# Patient Record
Sex: Male | Born: 1970
Health system: Southern US, Community
[De-identification: ages and names within clinical notes are randomized; demographics above are authoritative.]

## PROBLEM LIST (undated history)

## (undated) DIAGNOSIS — I10 Essential (primary) hypertension: Secondary | ICD-10-CM

## (undated) DIAGNOSIS — E7800 Pure hypercholesterolemia, unspecified: Secondary | ICD-10-CM

## (undated) HISTORY — PX: WISDOM TOOTH EXTRACTION: SHX21

## (undated) HISTORY — PX: TONSILLECTOMY: SUR1361

---

## 2012-03-07 DIAGNOSIS — L909 Atrophic disorder of skin, unspecified: Secondary | ICD-10-CM | POA: Insufficient documentation

## 2012-03-08 ENCOUNTER — Emergency Department (HOSPITAL_BASED_OUTPATIENT_CLINIC_OR_DEPARTMENT_OTHER)
Admission: EM | Admit: 2012-03-08 | Discharge: 2012-03-08 | Disposition: A | Discharge status: Home / Self Care | Payer: BC Managed Care – PPO | Attending: Emergency Medicine | Admitting: Emergency Medicine

## 2015-11-26 DIAGNOSIS — I10 Essential (primary) hypertension: Secondary | ICD-10-CM | POA: Insufficient documentation

## 2016-01-22 DIAGNOSIS — R7301 Impaired fasting glucose: Secondary | ICD-10-CM | POA: Insufficient documentation

## 2016-01-22 DIAGNOSIS — E7800 Pure hypercholesterolemia, unspecified: Secondary | ICD-10-CM | POA: Insufficient documentation

## 2016-01-22 DIAGNOSIS — R748 Abnormal levels of other serum enzymes: Secondary | ICD-10-CM | POA: Insufficient documentation

## 2016-02-25 DIAGNOSIS — G4733 Obstructive sleep apnea (adult) (pediatric): Secondary | ICD-10-CM | POA: Insufficient documentation

## 2016-08-17 ENCOUNTER — Emergency Department (HOSPITAL_BASED_OUTPATIENT_CLINIC_OR_DEPARTMENT_OTHER)
Admission: EM | Admit: 2016-08-17 | Discharge: 2016-08-17 | Disposition: A | Discharge status: Home / Self Care | Payer: BLUE CROSS/BLUE SHIELD | Attending: Emergency Medicine | Admitting: Emergency Medicine

## 2016-08-17 ENCOUNTER — Encounter (HOSPITAL_BASED_OUTPATIENT_CLINIC_OR_DEPARTMENT_OTHER): Payer: Self-pay | Admitting: *Deleted

## 2016-08-17 DIAGNOSIS — Z79899 Other long term (current) drug therapy: Secondary | ICD-10-CM | POA: Diagnosis not present

## 2016-08-17 DIAGNOSIS — K0889 Other specified disorders of teeth and supporting structures: Secondary | ICD-10-CM | POA: Diagnosis present

## 2016-08-17 DIAGNOSIS — I10 Essential (primary) hypertension: Secondary | ICD-10-CM | POA: Diagnosis not present

## 2016-08-17 HISTORY — DX: Pure hypercholesterolemia, unspecified: E78.00

## 2016-08-17 HISTORY — DX: Essential (primary) hypertension: I10

## 2016-08-17 MED ORDER — TRAMADOL HCL 50 MG PO TABS: 50.0000 mg | ORAL_TABLET | Freq: Four times a day (QID) | ORAL | 0 refills | Status: AC | PRN

## 2016-08-17 MED ORDER — AMOXICILLIN 500 MG PO CAPS: 500.0000 mg | ORAL_CAPSULE | Freq: Three times a day (TID) | ORAL | 0 refills | Status: AC

## 2016-08-17 NOTE — ED Provider Notes (Signed)
[facility] Provider [facility]   CSN: [id number] Arrival date & time: 08/17/16  [id number]     History   Chief Complaint Chief Complaint  Patient presents with  . Nasal Congestion  . Dental Pain    HPI James Sutton is a 45 y.o. male. Emergency evaluation of tooth pain. His left maxillary first molar that has become painful. Hurts to bite. He feels like he might be swollen in his face as well. Minimal cough. Minimal URI symptoms. No sinus pressure  HPI  Past Medical History:  Diagnosis Date  . Hypercholesteremia   . Hypertension     There are no active problems to display for this patient.   History reviewed. No pertinent surgical history.     Home Medications    Prior to Admission medications   Medication Sig Start Date End Date Taking? Authorizing Provider  amLODipine-olmesartan (AZOR) 10-40 MG tablet Take 1 tablet by mouth daily.   Yes Historical Provider, MD  atorvastatin (LIPITOR) 40 MG tablet Take 40 mg by mouth daily at 6 PM.   Yes Historical Provider, MD  cloNIDine (CATAPRES) 0.1 MG tablet Take 0.1 mg by mouth 2 (two) times daily.   Yes Historical Provider, MD  hydrALAZINE (APRESOLINE) 50 MG tablet Take 50 mg by mouth 3 (three) times daily.   Yes Historical Provider, MD  labetalol (NORMODYNE) 300 MG tablet Take 300 mg by mouth 2 (two) times daily.   Yes Historical Provider, MD  potassium chloride SA (K-DUR,KLOR-CON) 20 MEQ tablet Take 40 mEq by mouth 2 (two) times daily.   Yes Historical Provider, MD  spironolactone (ALDACTONE) 25 MG tablet Take 50 mg by mouth daily.   Yes Historical Provider, MD  amoxicillin (AMOXIL) 500 MG capsule Take 1 capsule (500 mg total) by mouth 3 (three) times daily. 08/17/16   Rolland PorterMark Senia Even, MD  traMADol (ULTRAM) 50 MG tablet Take 1 tablet (50 mg total) by mouth every 6 (six) hours as needed. 08/17/16   Rolland PorterMark Nico Syme, MD    Family History No family history on file.  Social History Social History  Substance Use Topics  . Smoking  status: Never Smoker  . Smokeless tobacco: Never Used  . Alcohol use Yes     Comment: occ throughout month     Allergies   Patient has no known allergies.   Review of Systems Review of Systems  Constitutional: Negative for appetite change, chills, diaphoresis, fatigue and fever.  HENT: Positive for dental problem and sinus pain. Negative for mouth sores, sore throat and trouble swallowing.   Eyes: Negative for visual disturbance.  Respiratory: Negative for cough, chest tightness, shortness of breath and wheezing.   Cardiovascular: Negative for chest pain.  Gastrointestinal: Negative for abdominal distention, abdominal pain, diarrhea, nausea and vomiting.  Endocrine: Negative for polydipsia, polyphagia and polyuria.  Genitourinary: Negative for dysuria, frequency and hematuria.  Musculoskeletal: Negative for gait problem.  Skin: Negative for color change, pallor and rash.  Neurological: Negative for dizziness, syncope, light-headedness and headaches.  Hematological: Does not bruise/bleed easily.  Psychiatric/Behavioral: Negative for behavioral problems and confusion.     Physical Exam Updated Vital Signs BP 140/72 (BP Location: Left Arm)   Pulse 89   Temp 98.8 F (37.1 C) (Oral)   Resp 18   Ht 5\' 11"  (1.803 m)   Wt 265 lb (120.2 kg)   SpO2 98%   BMI 36.96 kg/m   Physical Exam  Constitutional: He is oriented to person, place, and time. He appears well-developed and  well-nourished. No distress.  HENT:  Head: Normocephalic.  No-show facial swelling. Tooth of concern is his left maxillary first molar. His second molar is absent. There is no periodontal swelling no obvious tooth injury or fracture. No gingival reaction.  Eyes: Conjunctivae are normal. Pupils are equal, round, and reactive to light. No scleral icterus.  Neck: Normal range of motion. Neck supple. No thyromegaly present.  Cardiovascular: Normal rate and regular rhythm.  Exam reveals no gallop and no friction  rub.   No murmur heard. Pulmonary/Chest: Effort normal and breath sounds normal. No respiratory distress. He has no wheezes. He has no rales.  Abdominal: Soft. Bowel sounds are normal. He exhibits no distension. There is no tenderness. There is no rebound.  Musculoskeletal: Normal range of motion.  Neurological: He is alert and oriented to person, place, and time.  Skin: Skin is warm and dry. No rash noted.  Psychiatric: He has a normal mood and affect. His behavior is normal.     ED Treatments / Results  Labs (all labs ordered are listed, but only abnormal results are displayed) Labs Reviewed - No data to display  EKG  EKG Interpretation None       Radiology No results found.  Procedures Procedures (including critical care time)  Medications Ordered in ED Medications - No data to display   Initial Impression / Assessment and Plan / ED Course  I have reviewed the triage vital signs and the nursing notes.  Pertinent labs & imaging results that were available during my care of the patient were reviewed by me and considered in my medical decision making (see chart for details).  Clinical Course     Possibilities for yielded included periodontal infection or periapical abscess versus sinus infection with referred pain. Plan amoxicillin. Dental follow up  Final Clinical Impressions(s) / ED Diagnoses   Final diagnoses:  Pain, dental    New Prescriptions New Prescriptions   AMOXICILLIN (AMOXIL) 500 MG CAPSULE    Take 1 capsule (500 mg total) by mouth 3 (three) times daily.   TRAMADOL (ULTRAM) 50 MG TABLET    Take 1 tablet (50 mg total) by mouth every 6 (six) hours as needed.     Rolland PorterMark Janiah Devinney, MD 08/17/16 [phone]

## 2016-08-17 NOTE — ED Triage Notes (Signed)
Pt reports nasal congestion, productive cough and sinus pressure x2days. Denies sore throat, n/v/d. Reports chills and sweats. Also reports L upper dental pain that began yesterday.

## 2016-08-17 NOTE — Discharge Instructions (Signed)
Dental follow-up as soon as possible.

## 2016-08-25 ENCOUNTER — Emergency Department (HOSPITAL_BASED_OUTPATIENT_CLINIC_OR_DEPARTMENT_OTHER)
Admission: EM | Admit: 2016-08-25 | Discharge: 2016-08-25 | Disposition: A | Discharge status: Home / Self Care | Payer: BLUE CROSS/BLUE SHIELD | Attending: Emergency Medicine | Admitting: Emergency Medicine

## 2016-08-25 ENCOUNTER — Emergency Department (HOSPITAL_BASED_OUTPATIENT_CLINIC_OR_DEPARTMENT_OTHER): Payer: BLUE CROSS/BLUE SHIELD

## 2016-08-25 ENCOUNTER — Encounter (HOSPITAL_BASED_OUTPATIENT_CLINIC_OR_DEPARTMENT_OTHER): Payer: Self-pay | Admitting: *Deleted

## 2016-08-25 DIAGNOSIS — Z79899 Other long term (current) drug therapy: Secondary | ICD-10-CM | POA: Diagnosis not present

## 2016-08-25 DIAGNOSIS — M25571 Pain in right ankle and joints of right foot: Secondary | ICD-10-CM | POA: Insufficient documentation

## 2016-08-25 DIAGNOSIS — I10 Essential (primary) hypertension: Secondary | ICD-10-CM | POA: Diagnosis not present

## 2016-08-25 MED ORDER — HYDROCODONE-ACETAMINOPHEN 5-325 MG PO TABS: 1.0000 | ORAL_TABLET | ORAL | 0 refills | Status: AC | PRN

## 2016-08-25 NOTE — ED Notes (Signed)
ED Provider at bedside. 

## 2016-08-25 NOTE — ED Provider Notes (Signed)
[facility]   CSN: [id number] Arrival date & time: 08/25/16  0757     History   Chief Complaint Chief Complaint  Patient presents with  . Foot Pain    HPI James Sutton is a 46 y.o. male.  HPI  Patient has had pain in his right ankle for the last 2 days. States he woke up in the morning with the pain. No trauma. No known injury. He has not had pains like this before. No knee pain. No chest pain or trouble breathing. No fevers. Past Medical History:  Diagnosis Date  . Hypercholesteremia   . Hypertension     There are no active problems to display for this patient.   Past Surgical History:  Procedure Laterality Date  . TONSILLECTOMY    . WISDOM TOOTH EXTRACTION         Home Medications    Prior to Admission medications   Medication Sig Start Date End Date Taking? Authorizing Provider  amLODipine-olmesartan (AZOR) 10-40 MG tablet Take 1 tablet by mouth daily.   Yes Historical Provider, MD  atorvastatin (LIPITOR) 40 MG tablet Take 40 mg by mouth daily at 6 PM.   Yes Historical Provider, MD  cloNIDine (CATAPRES) 0.1 MG tablet Take 0.1 mg by mouth 2 (two) times daily.   Yes Historical Provider, MD  hydrALAZINE (APRESOLINE) 50 MG tablet Take 50 mg by mouth 3 (three) times daily.   Yes Historical Provider, MD  labetalol (NORMODYNE) 300 MG tablet Take 300 mg by mouth 2 (two) times daily.   Yes Historical Provider, MD  potassium chloride SA (K-DUR,KLOR-CON) 20 MEQ tablet Take 40 mEq by mouth 2 (two) times daily.   Yes Historical Provider, MD  spironolactone (ALDACTONE) 25 MG tablet Take 50 mg by mouth daily.   Yes Historical Provider, MD  traMADol (ULTRAM) 50 MG tablet Take 1 tablet (50 mg total) by mouth every 6 (six) hours as needed. 08/17/16  Yes Rolland Porter, MD  amoxicillin (AMOXIL) 500 MG capsule Take 1 capsule (500 mg total) by mouth 3 (three) times daily. 08/17/16   Rolland Porter, MD  HYDROcodone-acetaminophen (NORCO/VICODIN) 5-325 MG tablet Take  1-2 tablets by mouth every 4 (four) hours as needed. 08/25/16   Benjiman Core, MD    Family History No family history on file.  Social History Social History  Substance Use Topics  . Smoking status: Never Smoker  . Smokeless tobacco: Never Used  . Alcohol use Yes     Comment: occ throughout month     Allergies   Patient has no known allergies.   Review of Systems Review of Systems  Constitutional: Negative for appetite change.  Respiratory: Negative for chest tightness.   Cardiovascular: Negative for chest pain.  Musculoskeletal: Negative for back pain.       Right ankle pain     Physical Exam Updated Vital Signs BP 166/88 (BP Location: Left Arm)   Pulse 93   Temp 99 F (37.2 C) (Oral)   Resp 16   SpO2 97%   Physical Exam  Constitutional: He appears well-developed.  Musculoskeletal:  Tenderness swelling and mild erythema medially on the right ankle and down towards the foot. Neurovascular intact in the foot. Pulse intact. Ankle joint is not irritable.  Skin: Skin is warm. Capillary refill takes less than 2 seconds.     ED Treatments / Results  Labs (all labs ordered are listed, but only abnormal results are displayed) Labs Reviewed - No data to display  EKG  EKG Interpretation None       Radiology Dg Ankle Complete Right  Result Date: 08/25/2016 CLINICAL DATA:  Ankle pain.  No injury. EXAM: RIGHT ANKLE - COMPLETE 3+ VIEW COMPARISON:  No recent prior. FINDINGS: Bony densities noted adjacent to the medial malleolus. These appear corticated most likely represent old fracture fragments. Spot acute fracture cannot be completely excluded. Similar findings noted about the posterior malleolus. Lateral malleolus is intact. Calcaneal spurring . IMPRESSION: Findings consistent with old medial malleolar and posterior malleolar fractures. Superimposed acute fractures cannot be completely excluded. Lateral malleolus intact. Electronically Signed   By: Maisie Fushomas  Register    On: 08/25/2016 08:30    Procedures Procedures (including critical care time)  Medications Ordered in ED Medications - No data to display   Initial Impression / Assessment and Plan / ED Course  I have reviewed the triage vital signs and the nursing notes.  Pertinent labs & imaging results that were available during my care of the patient were reviewed by me and considered in my medical decision making (see chart for details).  Clinical Course     Patient with ankle pain. No known injury now. No clear old injury either but x-ray shows likely old fractures. Without new injury and out there is an acute fracture but given ASO. Will follow-up with sports medicine as needed.  Final Clinical Impressions(s) / ED Diagnoses   Final diagnoses:  Acute right ankle pain    New Prescriptions New Prescriptions   HYDROCODONE-ACETAMINOPHEN (NORCO/VICODIN) 5-325 MG TABLET    Take 1-2 tablets by mouth every 4 (four) hours as needed.     Benjiman CoreNathan Yailen Zemaitis, MD 08/25/16 [phone]

## 2016-08-25 NOTE — ED Triage Notes (Signed)
Pt reports right foot swollen and painful since sautrday. Denies injury. Using OTC meds, ice, heat, and soaking in epsom salts without relief

## 2016-08-26 ENCOUNTER — Ambulatory Visit (INDEPENDENT_AMBULATORY_CARE_PROVIDER_SITE_OTHER): Payer: BLUE CROSS/BLUE SHIELD | Admitting: Family Medicine

## 2016-08-26 ENCOUNTER — Encounter: Payer: Self-pay | Admitting: Family Medicine

## 2016-08-26 DIAGNOSIS — M25571 Pain in right ankle and joints of right foot: Secondary | ICD-10-CM | POA: Diagnosis not present

## 2016-08-26 MED ORDER — PREDNISONE 10 MG PO TABS: ORAL_TABLET | ORAL | 0 refills | Status: DC

## 2016-08-26 MED ORDER — PREDNISONE 10 MG PO TABS: ORAL_TABLET | ORAL | 0 refills | Status: AC

## 2016-08-26 NOTE — Progress Notes (Signed)
PCP: MCFADDEN,JOHN C, MD  Subjective:   HPI: Patient is a 46 y.o. male here for right ankle pain, swelling.  Patient denies known injury. He states 4 days ago he woke up with swelling and pain of right ankle. Difficulty walking due to pain. Has been elevating, icing. Tried ASO from ED and felt more uncomfortable with this. Pain level 7/10, sharp. No skin changes, fever. Some warmth. No numbness or tingling.  Past Medical History:  Diagnosis Date  . Hypercholesteremia   . Hypertension     Current Outpatient Prescriptions on File Prior to Visit  Medication Sig Dispense Refill  . amLODipine-olmesartan (AZOR) 10-40 MG tablet Take 1 tablet by mouth daily.    Marland Kitchen. amoxicillin (AMOXIL) 500 MG capsule Take 1 capsule (500 mg total) by mouth 3 (three) times daily. 21 capsule 0  . atorvastatin (LIPITOR) 40 MG tablet Take 40 mg by mouth daily at 6 PM.    . cloNIDine (CATAPRES) 0.1 MG tablet Take 0.1 mg by mouth 2 (two) times daily.    . hydrALAZINE (APRESOLINE) 50 MG tablet Take 50 mg by mouth 3 (three) times daily.    Marland Kitchen. HYDROcodone-acetaminophen (NORCO/VICODIN) 5-325 MG tablet Take 1-2 tablets by mouth every 4 (four) hours as needed. 6 tablet 0  . labetalol (NORMODYNE) 300 MG tablet Take 300 mg by mouth 2 (two) times daily.    . potassium chloride SA (K-DUR,KLOR-CON) 20 MEQ tablet Take 40 mEq by mouth 2 (two) times daily.    Marland Kitchen. spironolactone (ALDACTONE) 25 MG tablet Take 50 mg by mouth daily.    . traMADol (ULTRAM) 50 MG tablet Take 1 tablet (50 mg total) by mouth every 6 (six) hours as needed. 15 tablet 0   No current facility-administered medications on file prior to visit.     Past Surgical History:  Procedure Laterality Date  . TONSILLECTOMY    . WISDOM TOOTH EXTRACTION      No Known Allergies  Social History   Social History  . Marital status: Single    Spouse name: N/A  . Number of children: N/A  . Years of education: N/A   Occupational History  . Not on file.    Social History Main Topics  . Smoking status: Never Smoker  . Smokeless tobacco: Never Used  . Alcohol use Yes     Comment: occ throughout month  . Drug use: No  . Sexual activity: Not on file   Other Topics Concern  . Not on file   Social History Narrative  . No narrative on file    No family history on file.  BP (!) 184/113   Pulse (!) 109   Ht 5\' 11"  (1.803 m)   Wt 250 lb (113.4 kg)   BMI 34.87 kg/m   Review of Systems: See HPI above.     Objective:  Physical Exam:  Gen: NAD, comfortable in exam room  Right ankle: Mod swelling, warmth diffusely about ankle.  No bruising, other deformity. Mild limitation ROM all directions. TTP diffusely anterior, medial, lateral ankle but not over malleoli, base 5th, navicular. Negative ant drawer and talar tilt.   Negative syndesmotic compression. Thompsons test negative. NV intact distally.  Left ankle: FROM without pain.   Assessment & Plan:  1. Right ankle pain - with swelling and warmth, no acute injury.  Consistent with acute gout flare.  Has not had gout before.  Start with prednisone dose pack x 6 days.  Can take ibuprofen or aleve after this.  Icing, elevation.  Call us in a week to let us know how he's doing. F/u prn.

## 2016-08-26 NOTE — Assessment & Plan Note (Signed)
with swelling and warmth, no acute injury.  Consistent with acute gout flare.  Has not had gout before.  Start with prednisone dose pack x 6 days.  Can take ibuprofen or aleve after this.  Icing, elevation.  Call us in a week to let us know how he's doing. F/u prn.

## 2016-08-26 NOTE — Patient Instructions (Signed)
You have an acute gout flare. Take prednisone dose pack for 6 days as directed. Day AFTER finishing this you can take aleve 2 tabs twice a day with food OR ibuprofen 600mg  three times a day with food. Icing 15minutes at a time 3-4 times a day. Elevate above your heart as much as possible. Call me if you're not improving as expected over the next week. Follow up with me as needed otherwise.

## 2017-10-12 ENCOUNTER — Encounter (HOSPITAL_BASED_OUTPATIENT_CLINIC_OR_DEPARTMENT_OTHER): Payer: Self-pay | Admitting: Emergency Medicine

## 2017-10-12 ENCOUNTER — Emergency Department (HOSPITAL_BASED_OUTPATIENT_CLINIC_OR_DEPARTMENT_OTHER)
Admission: EM | Admit: 2017-10-12 | Discharge: 2017-10-12 | Disposition: A | Discharge status: Home / Self Care | Payer: BLUE CROSS/BLUE SHIELD | Attending: Physician Assistant | Admitting: Physician Assistant

## 2017-10-12 ENCOUNTER — Emergency Department (HOSPITAL_BASED_OUTPATIENT_CLINIC_OR_DEPARTMENT_OTHER): Payer: BLUE CROSS/BLUE SHIELD

## 2017-10-12 ENCOUNTER — Other Ambulatory Visit: Payer: Self-pay

## 2017-10-12 DIAGNOSIS — Z79899 Other long term (current) drug therapy: Secondary | ICD-10-CM | POA: Insufficient documentation

## 2017-10-12 DIAGNOSIS — R69 Illness, unspecified: Secondary | ICD-10-CM

## 2017-10-12 DIAGNOSIS — J111 Influenza due to unidentified influenza virus with other respiratory manifestations: Secondary | ICD-10-CM | POA: Diagnosis not present

## 2017-10-12 DIAGNOSIS — I10 Essential (primary) hypertension: Secondary | ICD-10-CM | POA: Diagnosis not present

## 2017-10-12 DIAGNOSIS — R05 Cough: Secondary | ICD-10-CM | POA: Diagnosis present

## 2017-10-12 MED ORDER — IBUPROFEN 800 MG PO TABS
800.0000 mg | ORAL_TABLET | Freq: Once | ORAL | Status: AC
  Administered 2017-10-12: 800 mg via ORAL
  Filled 2017-10-12: qty 1

## 2017-10-12 MED ORDER — OSELTAMIVIR PHOSPHATE 75 MG PO CAPS: 75.0000 mg | ORAL_CAPSULE | Freq: Two times a day (BID) | ORAL | 0 refills | Status: AC

## 2017-10-12 MED ORDER — BENZONATATE 100 MG PO CAPS: 100.0000 mg | ORAL_CAPSULE | Freq: Three times a day (TID) | ORAL | 0 refills | Status: AC | PRN

## 2017-10-12 MED FILL — BENZONATATE 100 MG CAPSULE: 100 | 7 days supply | Qty: 20 | Fill #0

## 2017-10-12 MED FILL — OSELTAMIVIR PHOSPHATE 75 MG: 75 | 5 days supply | Qty: 10 | Fill #0

## 2017-10-12 NOTE — ED Triage Notes (Signed)
Reports cough, dizziness, weakness since Saturday.  Reports generalized body aches.  Denies nausea, vomiting, diarrhea.

## 2017-10-12 NOTE — Discharge Instructions (Signed)
Please rest, stay hydrated.  Please use lots of handwashing, use ibuprofen and Tylenol to treat your symptoms.  Please use medication provided to help with cough.  We have also given you the medication for flu that we discussed.

## 2017-10-12 NOTE — ED Provider Notes (Signed)
[facility] EMERGENCY DEPARTMENT Provider Note   CSN: [id number] Arrival date & time: 10/12/17  0807     History   Chief Complaint Chief Complaint  Patient presents with  . Cough    HPI Kinnie FeilKeith Quant is a 47 y.o. male.  HPI   47 year old male presenting with flulike symptoms.  Patient reports cough, congestion, fatigue, fevers for the last 3 days.  Patient reports the cough is occasionally productive.  Mild nausea occasional vomiting.  No diarrhea.  Patient reports only past medical history of hypertension.  Past Medical History:  Diagnosis Date  . Hypercholesteremia   . Hypertension     Patient Active Problem List   Diagnosis Date Noted  . Right ankle pain 08/26/2016  . Obstructive sleep apnea hypopnea, mild 02/25/2016  . Elevated liver enzymes 01/22/2016  . IFG (impaired fasting glucose) 01/22/2016  . Pure hypercholesterolemia 01/22/2016  . Benign essential hypertension 11/26/2015    Past Surgical History:  Procedure Laterality Date  . TONSILLECTOMY    . WISDOM TOOTH EXTRACTION         Home Medications    Prior to Admission medications   Medication Sig Start Date End Date Taking? Authorizing Provider  amLODipine-olmesartan (AZOR) 10-40 MG tablet Take 1 tablet by mouth daily.    [provider]  amoxicillin (AMOXIL) 500 MG capsule Take 1 capsule (500 mg total) by mouth 3 (three) times daily. 08/17/16   Rolland PorterJames, Mark, MD  atorvastatin (LIPITOR) 40 MG tablet Take 40 mg by mouth daily at 6 PM.    [provider]  cloNIDine (CATAPRES) 0.1 MG tablet Take 0.1 mg by mouth 2 (two) times daily.    [provider]  hydrALAZINE (APRESOLINE) 50 MG tablet Take 50 mg by mouth 3 (three) times daily.    [provider]  HYDROcodone-acetaminophen (NORCO/VICODIN) 5-325 MG tablet Take 1-2 tablets by mouth every 4 (four) hours as needed. 08/25/16   Benjiman CorePickering, Nathan, MD  labetalol (NORMODYNE) 300 MG tablet Take 300 mg by mouth 2  (two) times daily.    [provider]  potassium chloride SA (K-DUR,KLOR-CON) 20 MEQ tablet Take 40 mEq by mouth 2 (two) times daily.    [provider]  predniSONE (DELTASONE) 10 MG tablet 6 tabs po day 1, 5 tabs po day 2, 4 tabs po day 3, 3 tabs po day 4, 2 tabs po day 5, 1 tab po day 6 08/26/16   Hudnall, Azucena FallenShane R, MD  spironolactone (ALDACTONE) 25 MG tablet Take 50 mg by mouth daily.    [provider]  traMADol (ULTRAM) 50 MG tablet Take 1 tablet (50 mg total) by mouth every 6 (six) hours as needed. 08/17/16   Rolland PorterJames, Mark, MD    Family History History reviewed. No pertinent family history.  Social History Social History   Tobacco Use  . Smoking status: Never Smoker  . Smokeless tobacco: Never Used  Substance Use Topics  . Alcohol use: Yes    Comment: occ throughout month  . Drug use: No     Allergies   Patient has no known allergies.   Review of Systems Review of Systems  Constitutional: Positive for fatigue and fever. Negative for activity change.  Respiratory: Positive for cough. Negative for shortness of breath.   Cardiovascular: Negative for chest pain.  Gastrointestinal: Negative for abdominal pain.  Musculoskeletal: Positive for arthralgias and myalgias.  All other systems reviewed and are negative.    Physical Exam Updated Vital Signs BP 113/68 (BP  Location: Right Arm)   Pulse 81   Temp 99.1 F (37.3 C) (Oral)   Resp 18   Ht 5\' 11"  (1.803 m)   Wt 115.2 kg (254 lb)   SpO2 98%   BMI 35.43 kg/m   Physical Exam  Constitutional: He is oriented to person, place, and time. He appears well-nourished.  HENT:  Head: Normocephalic.  Mild erythema in the posterior pharynx.  Mild white adherent material on the right-hand side.  Patient denies symptoms and thinks has to do with vomiting earlier.  Eyes: Conjunctivae are normal. Right eye exhibits no discharge. Left eye exhibits no discharge.  Cardiovascular: Normal rate and regular rhythm.   No murmur heard. Pulmonary/Chest: Effort normal. No stridor. No respiratory distress. He has no wheezes.  Left lower rhonchi.  Mild tachypnea.  Abdominal: Soft. He exhibits no distension. There is no tenderness.  Neurological: He is oriented to person, place, and time.  Skin: Skin is warm and dry. He is not diaphoretic.  Psychiatric: He has a normal mood and affect. His behavior is normal.     ED Treatments / Results  Labs (all labs ordered are listed, but only abnormal results are displayed) Labs Reviewed - No data to display  EKG  EKG Interpretation None       Radiology No results found.  Procedures Procedures (including critical care time)  Medications Ordered in ED Medications  ibuprofen (ADVIL,MOTRIN) tablet 800 mg (800 mg Oral Given 10/12/17 0848)     Initial Impression / Assessment and Plan / ED Course  I have reviewed the triage vital signs and the nursing notes.  Pertinent labs & imaging results that were available during my care of the patient were reviewed by me and considered in my medical decision making (see chart for details).      47 year old male presenting with flulike symptoms.  Patient reports cough, congestion, fatigue, fevers for the last 3 days.  Patient reports the cough is occasionally productive.  Mild nausea occasional vomiting.  No diarrhea.  Patient reports only past medical history of hypertension.  8:51 AM Will get chest x-ray to rule out pneumonia.  We will otherwise treat like flulike illness.  Discussion had about Tamiflu.  Will offer prescription  Final Clinical Impressions(s) / ED Diagnoses   Final diagnoses:  None    ED Discharge Orders    None       Abelino Derrick, MD 10/12/17 1548

## 2019-06-01 IMAGING — CR DG CHEST 2V
2 series · 2 of 2 positions shown · non-contrast
Comparison: None.

CLINICAL DATA: Body aches and fever, chest pain.

EXAM:
CHEST  2 VIEW

[w chest pa]
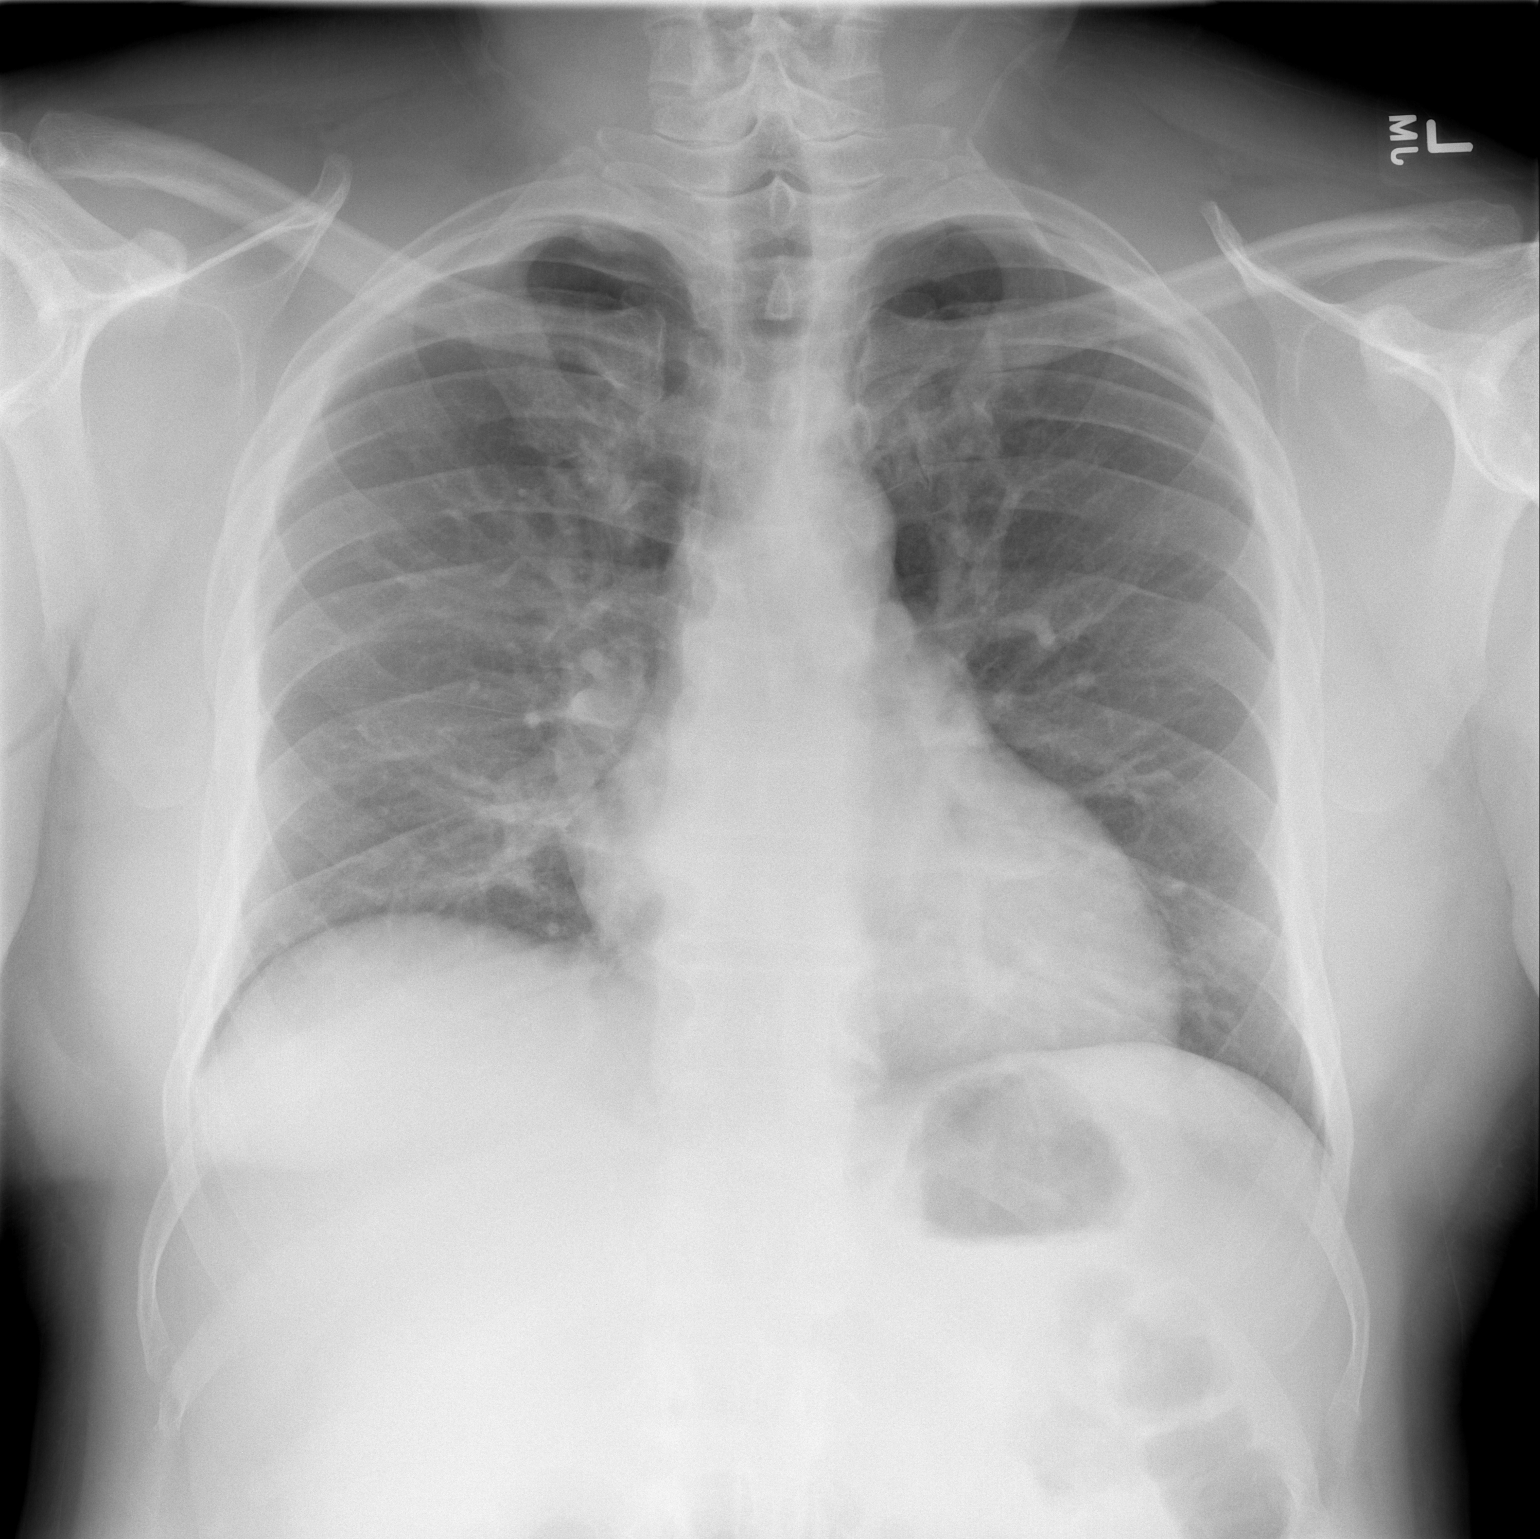

[w chest lat *]
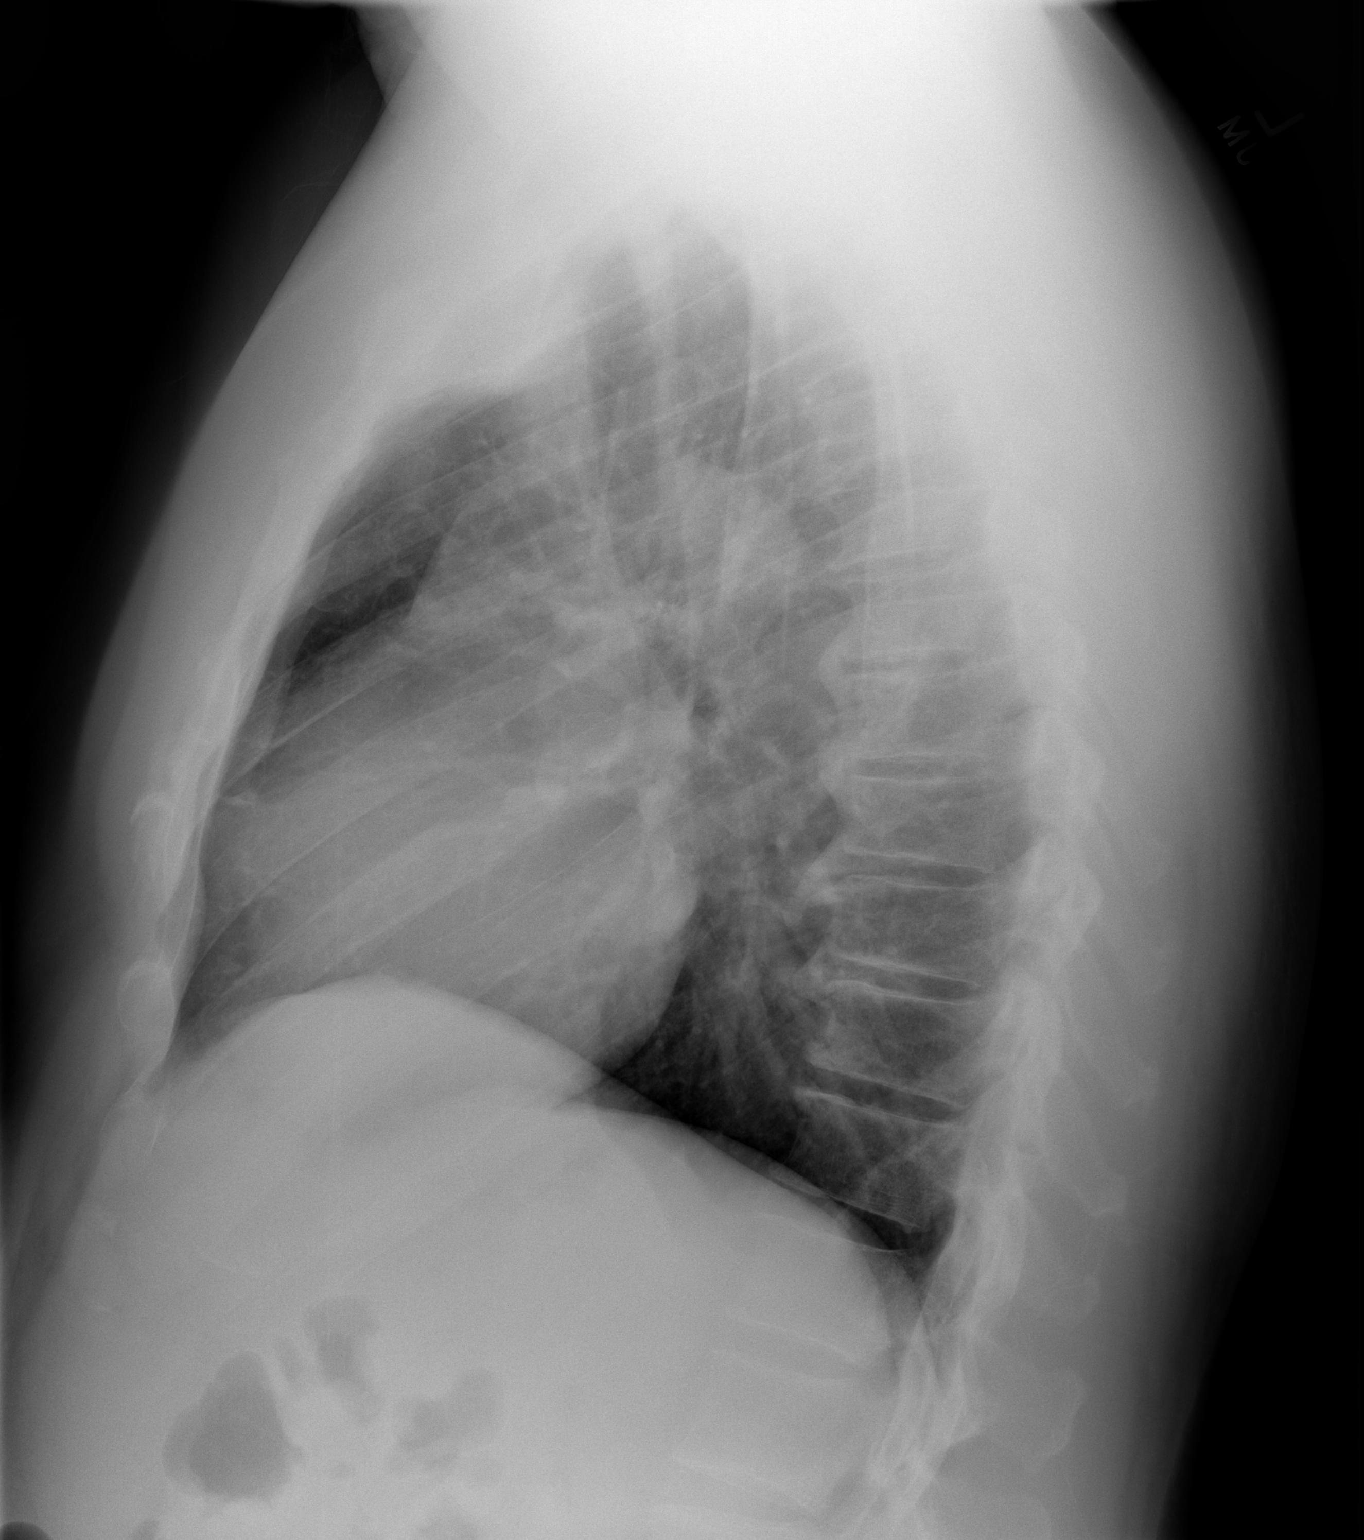

[2 of 2 positions shown; findings below may reference images not displayed]

FINDINGS: Trachea is midline. Heart size normal. Lungs are clear. No pleural
fluid. Degenerative changes are seen in spine.
IMPRESSION: No acute findings.

## 2022-07-23 ENCOUNTER — Encounter (HOSPITAL_BASED_OUTPATIENT_CLINIC_OR_DEPARTMENT_OTHER): Payer: Self-pay | Admitting: Pediatrics

## 2022-07-23 ENCOUNTER — Other Ambulatory Visit: Payer: Self-pay

## 2022-07-23 ENCOUNTER — Emergency Department (HOSPITAL_BASED_OUTPATIENT_CLINIC_OR_DEPARTMENT_OTHER)
Admission: EM | Admit: 2022-07-23 | Discharge: 2022-07-23 | Disposition: A | Discharge status: Home / Self Care | Payer: BC Managed Care – PPO | Attending: Emergency Medicine | Admitting: Emergency Medicine

## 2022-07-23 DIAGNOSIS — Z79899 Other long term (current) drug therapy: Secondary | ICD-10-CM | POA: Diagnosis not present

## 2022-07-23 DIAGNOSIS — R0981 Nasal congestion: Secondary | ICD-10-CM | POA: Diagnosis present

## 2022-07-23 DIAGNOSIS — U071 COVID-19: Secondary | ICD-10-CM | POA: Insufficient documentation

## 2022-07-23 LAB — RESP PANEL BY RT-PCR (FLU A&B, COVID) ARPGX2
Influenza A by PCR: NEGATIVE
Influenza B by PCR: NEGATIVE
SARS Coronavirus 2 by RT PCR: POSITIVE — AB

## 2022-07-23 MED ORDER — MOLNUPIRAVIR EUA 200MG CAPSULE: 4.0000 | ORAL_CAPSULE | Freq: Two times a day (BID) | ORAL | 0 refills | Status: AC

## 2022-07-23 NOTE — ED Triage Notes (Signed)
C/O head congestion and cough since yesterday; takes alka seltzer nigh time with some relief.

## 2022-07-23 NOTE — ED Provider Notes (Signed)
[facility] EMERGENCY DEPARTMENT Provider Note   CSN: [id number] Arrival date & time: 07/23/22  1230     History  Chief Complaint  Patient presents with   Nasal Congestion   Cough    James Sutton is a 51 y.o. male who presents to the ED with concerns for nasal congestion onset yesterday. Pt has associated cough, rhinorrhea. Has tried OTC alka seltzer for his symptoms. Denies trouble breathing, sore throat, chest pain.   The history is provided by the patient. No language interpreter was used.       Home Medications Prior to Admission medications   Medication Sig Start Date End Date Taking? Authorizing Provider  molnupiravir EUA (LAGEVRIO) 200 mg CAPS capsule Take 4 capsules (800 mg total) by mouth 2 (two) times daily for 5 days. 07/23/22 07/28/22 Yes Gladiola Madore A, PA-C  amLODipine-olmesartan (AZOR) 10-40 MG tablet Take 1 tablet by mouth daily.    [provider]  amoxicillin (AMOXIL) 500 MG capsule Take 1 capsule (500 mg total) by mouth 3 (three) times daily. 08/17/16   Rolland Porter, MD  atorvastatin (LIPITOR) 40 MG tablet Take 40 mg by mouth daily at 6 PM.    [provider]  benzonatate (TESSALON PERLES) 100 MG capsule Take 1 capsule (100 mg total) by mouth 3 (three) times daily as needed for cough. 10/12/17   Mackuen, Courteney Lyn, MD  cloNIDine (CATAPRES) 0.1 MG tablet Take 0.1 mg by mouth 2 (two) times daily.    [provider]  hydrALAZINE (APRESOLINE) 50 MG tablet Take 50 mg by mouth 3 (three) times daily.    [provider]  HYDROcodone-acetaminophen (NORCO/VICODIN) 5-325 MG tablet Take 1-2 tablets by mouth every 4 (four) hours as needed. 08/25/16   Benjiman Core, MD  labetalol (NORMODYNE) 300 MG tablet Take 300 mg by mouth 2 (two) times daily.    [provider]  oseltamivir (TAMIFLU) 75 MG capsule Take 1 capsule (75 mg total) by mouth every 12 (twelve) hours. 10/12/17   Mackuen, Courteney Lyn, MD  potassium chloride  SA (K-DUR,KLOR-CON) 20 MEQ tablet Take 40 mEq by mouth 2 (two) times daily.    [provider]  predniSONE (DELTASONE) 10 MG tablet 6 tabs po day 1, 5 tabs po day 2, 4 tabs po day 3, 3 tabs po day 4, 2 tabs po day 5, 1 tab po day 6 08/26/16   Hudnall, Azucena Fallen, MD  spironolactone (ALDACTONE) 25 MG tablet Take 50 mg by mouth daily.    [provider]  traMADol (ULTRAM) 50 MG tablet Take 1 tablet (50 mg total) by mouth every 6 (six) hours as needed. 08/17/16   Rolland Porter, MD      Allergies    Patient has no known allergies.    Review of Systems   Review of Systems  Respiratory:  Positive for cough.   All other systems reviewed and are negative.   Physical Exam Updated Vital Signs BP (!) 158/101 (BP Location: Right Arm)   Pulse 89   Temp 99.2 F (37.3 C) (Oral)   Resp 18   Ht 6' (1.829 m)   Wt 127 kg   SpO2 99%   BMI 37.97 kg/m  Physical Exam Vitals and nursing note reviewed.  Constitutional:      General: He is not in acute distress.    Appearance: He is not diaphoretic.  HENT:     Head: Normocephalic and atraumatic.     Mouth/Throat:  Mouth: Mucous membranes are moist.     Pharynx: Oropharynx is clear. Uvula midline. No oropharyngeal exudate or posterior oropharyngeal erythema.     Tonsils: No tonsillar exudate or tonsillar abscesses.     Comments: Uvula midline without swelling. No posterior pharyngeal erythema or tonsillar exudate noted. Patent airway. Pt able to speak in clear complete sentences. Tolerating oral secretions. Eyes:     General: No scleral icterus.    Conjunctiva/sclera: Conjunctivae normal.  Cardiovascular:     Rate and Rhythm: Normal rate and regular rhythm.     Pulses: Normal pulses.     Heart sounds: Normal heart sounds.  Pulmonary:     Effort: Pulmonary effort is normal. No respiratory distress.     Breath sounds: Normal breath sounds. No wheezing.  Abdominal:     General: Bowel sounds are normal.     Palpations: Abdomen is  soft. There is no mass.     Tenderness: There is no abdominal tenderness. There is no guarding or rebound.  Musculoskeletal:        General: Normal range of motion.     Cervical back: Normal range of motion and neck supple.  Skin:    General: Skin is warm and dry.  Neurological:     Mental Status: He is alert.  Psychiatric:        Behavior: Behavior normal.     ED Results / Procedures / Treatments   Labs (all labs ordered are listed, but only abnormal results are displayed) Labs Reviewed  RESP PANEL BY RT-PCR (FLU A&B, COVID) ARPGX2 - Abnormal; Notable for the following components:      Result Value   SARS Coronavirus 2 by RT PCR POSITIVE (*)    All other components within normal limits    EKG None  Radiology No results found.  Procedures Procedures    Medications Ordered in ED Medications - No data to display  ED Course/ Medical Decision Making/ A&P Clinical Course as of 07/24/22 1417  Wed Jul 23, 2022  1434 SARS Coronavirus 2 by RT PCR(!): POSITIVE [SB]  1458 Discussed with patient discharge treatment plan. Answered all available questions. Pt appears safe for discharge at this time.  [SB]    Clinical Course User Index [SB] Loralyn Rachel A, PA-C                           Medical Decision Making Amount and/or Complexity of Data Reviewed Labs:  Decision-making details documented in ED Course.   Pt presents with concerns for nasal congestion onset yesterday.  Denies sick contacts.  Patient afebrile.  On exam patient without acute cardiovascular, respiratory exam findings.  Differential diagnosis includes COVID, flu, pneumonia, viral URI with cough.   Labs:  I ordered, and personally interpreted labs.  The pertinent results include:   COVID swab positive Flu swab negative  Disposition: Pt presentation suspicious for COVID-19. Doubt flu, PNA, or viral URI with cough at this time. After consideration of the diagnostic results and the patients response to  treatment, I feel that the patient would benefit from Discharge home. Pt will be discharged home with Molnupiravir. Thorough discussion regarding quarantine/isolation period as per CDC guidelines. Work note provided. Supportive care measures and strict return precautions discussed with patient at bedside. Pt acknowledges and verbalizes understanding. Pt appears safe for discharge. Follow up as indicated in discharge paperwork.    This chart was dictated using voice recognition software, Dragon. Despite the best efforts  of this provider to proofread and correct errors, errors may still occur which can change documentation meaning.   Final Clinical Impression(s) / ED Diagnoses Final diagnoses:  COVID-19    Rx / DC Orders ED Discharge Orders          Ordered    molnupiravir EUA (LAGEVRIO) 200 mg CAPS capsule  2 times daily        07/23/22 1502              Goldie Dimmer A, PA-C 07/24/22 1434    Vanetta Mulders, MD 08/02/22 1717

## 2022-07-23 NOTE — Discharge Instructions (Addendum)
It was a pleasure taking care of you!   Your Flu swab was negative. Your COVID swab was positive today.  According to the CDC, you must self quarantine for 5 days from the start of your symptoms. Your quarantine period ends on  07/27/2022.  You will be sent a prescription for Molnupiravir. You may take over-the-counter cough and cold medications as needed for your symptoms. Ensure to maintain fluid intake with tea, soup, broth, Pedialyte, Gatorade, water. You may follow-up with your primary care provider as needed.  Return to the Emergency Department if you are experiencing trouble breathing, chest pain, decreased fluid intake or worsening symptoms.
